# Patient Record
Sex: Female | Born: 1985 | Race: White | Hispanic: No | Marital: Married
Health system: Southern US, Community
[De-identification: ages and names within clinical notes are randomized; demographics above are authoritative.]

---

## 2008-06-03 ENCOUNTER — Inpatient Hospital Stay (HOSPITAL_COMMUNITY): Admission: AD | Admit: 2008-06-03 | Discharge: 2008-06-05 | Payer: Self-pay | Admitting: Obstetrics and Gynecology

## 2010-09-24 ENCOUNTER — Inpatient Hospital Stay (HOSPITAL_COMMUNITY)
Admission: RE | Admit: 2010-09-24 | Discharge: 2010-09-26 | DRG: 775 | Disposition: A | Discharge status: Home / Self Care | Payer: Medicaid Other | Source: Ambulatory Visit | Attending: Obstetrics and Gynecology | Admitting: Obstetrics and Gynecology

## 2010-09-24 DIAGNOSIS — Z22330 Carrier of Group B streptococcus: Secondary | ICD-10-CM

## 2010-09-24 DIAGNOSIS — O99892 Other specified diseases and conditions complicating childbirth: Principal | ICD-10-CM | POA: Diagnosis present

## 2010-09-24 LAB — RPR: RPR Ser Ql: NONREACTIVE

## 2010-09-24 LAB — CBC
HCT: 38.1 % (ref 36.0–46.0)
Hemoglobin: 12.6 g/dL (ref 12.0–15.0)
MCH: 27.5 pg (ref 26.0–34.0)
MCV: 83.2 fL (ref 78.0–100.0)
RBC: 4.58 MIL/uL (ref 3.87–5.11)

## 2010-09-25 LAB — CBC
HCT: 36.3 % (ref 36.0–46.0)
Hemoglobin: 11.8 g/dL — ABNORMAL LOW (ref 12.0–15.0)
MCH: 27.3 pg (ref 26.0–34.0)
MCV: 83.8 fL (ref 78.0–100.0)
RBC: 4.33 MIL/uL (ref 3.87–5.11)

## 2010-09-29 ENCOUNTER — Inpatient Hospital Stay (HOSPITAL_COMMUNITY): Admission: AD | Admit: 2010-09-29 | Payer: Self-pay | Admitting: Obstetrics and Gynecology

## 2010-10-05 NOTE — Discharge Summary (Signed)
  NAMEDAISHA, [name]               ACCOUNT NO.:  [id number]  MEDICAL RECORD NO.:  [id number]           PATIENT TYPE:  I  LOCATION:  9147                          FACILITY:  [facility]  PHYSICIAN:  Huel Cote, M.D. DATE OF BIRTH:  January 22, 1986  DATE OF ADMISSION:  09/24/2010 DATE OF DISCHARGE:  09/26/2010                              DISCHARGE SUMMARY   DISCHARGE DIAGNOSES: 1. Term pregnancy at 39-2/7 weeks, delivered. 2. Positive group B strep status. 3. Status post normal spontaneous vaginal delivery.  DISCHARGE MEDICATIONS: 1. Motrin 600 mg p.o. every 6 hours. 2. Percocet 1-2 tablets p.o. every 4 hours p.r.n. 3. The patient plans Mirena for postpartum contraception.  Hemoglobin on discharge is 11.8.  HOSPITAL COURSE:  The patient is a 25 year old G2 P 1-0-0-1 who came in at 39-2/7 weeks' gestation for induction of labor given term status and favorable cervix.  Prenatal care had been uneventful except for positive group B strep status.  Prenatal labs are as follows:  O+ antibody negative, rubella immune, hepatitis B surface antigen negative, HIV negative, RPR nonreactive, rubella immune, hepatitis B surface antigen negative, GC negative, Chlamydia negative, quad screen negative, group B strep positive, 1-hour Glucola 142, 3-hour Glucola normal.  PAST MEDICAL HISTORY:  None.  PAST SURGICAL HISTORY:  None.  PAST OBSTETRICAL HISTORY:  In 2009, she had a vaginal delivery of an 8 pounds 11 ounces infant.  PAST GYN HISTORY:  None.  ALLERGIES:  None.  MEDICATIONS:  Prenatal vitamins.  SOCIAL HISTORY:  She uses no tobacco, alcohol, or drugs.  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  She was afebrile with stable vital signs.  Fetal heart rate was reactive on admission.  Cervix was 52+ and -2 station.  Estimated fetal weight was 8-1/2 to 9 pounds.  She was placed on penicillin for her group B strep prophylaxis and once that had been on board had rupture of membranes with  clear fluid noted.  She then was placed on Pitocin and progressed well throughout the day.  She reached complete dilation and pushed well with a normal spontaneous vaginal delivery of a viable female infant over small first-degree laceration.  Apgars were 8 and 9, weight was 8 pounds 12 ounces.  Placenta delivery was spontaneous after the cord blood donation and first-degree laceration was repaired with 3-0 Vicryl for hemostasis.  Estimated blood loss was 400 mL.  She was admitted for routine postpartum care.  She did quite well on postpartum day #2.  Her pain was well controlled.  She had no complaints.  Fundus was firm.  Lochia normal, and she was felt stable for discharge home.     Huel Cote, M.D.     KR/MEDQ  D:  09/26/2010  T:  09/26/2010  Job:  [id number]  Electronically Signed by Huel Cote M.D. on 10/05/2010 08:47:06 AM

## 2010-12-24 NOTE — Discharge Summary (Signed)
[name], [name]           ACCOUNT NO.:  [id number]   MEDICAL RECORD NO.:  [id number]          PATIENT TYPE:  INP   LOCATION:  9111                          FACILITY:  [facility]   PHYSICIAN:  Huel Cote, M.D. DATE OF BIRTH:  [birth year]   DATE OF ADMISSION:  06/03/2008  DATE OF DISCHARGE:  06/05/2008                               DISCHARGE SUMMARY   DISCHARGE DIAGNOSES:  1. Term pregnancy at 61 and 4/7th weeks, delivered.  2. Status post normal spontaneous vaginal delivery.   DISCHARGE MEDICATIONS:  1. Motrin 600 mg p.o. every 6 hours.  2. Percocet 1-2 tablets p.r.n. every 4 hours p.r.n. pain.   DISCHARGE FOLLOWUP:  The patient is to follow up in the office in 6  weeks for her routine postpartum exam.   HOSPITAL COURSE:  The patient is a 25 year old G1, P0 who was admitted  at 39 and 4/7th weeks' gestation to Labor and Delivery for induction  given a favorable cervix and term care.  Prenatal care was eventful only  for size greater than dates.  On her last ultrasound, her EF weight was  8 pounds 1 ounce and she also had an ultrasound that demonstrated a  slightly larger than normal extrarenal pelvis.  No other structures were  abnormal including no hydronephrosis.   PRENATAL LABS:  An O positive, antibody negative, RPR nonreactive,  rubella immune, hepatitis B surface antigen negative, HIV negative, GC  negative, Chlamydia negative, group B strep negative, and 1-hour Glucola  normal.   PAST OBSTETRICAL HISTORY:  None.   PAST GYN HISTORY:  None.   PAST MEDICAL HISTORY:  None.   PAST SURGICAL HISTORY:  None.   ALLERGIES:  No allergies.   PHYSICAL EXAMINATION:  VITAL SIGNS:  On admission, she was afebrile with  stable vital signs.  PELVIC:  Fetal heart rate was reactive.  Cervix was 52 and -2 station.  She had rupture of membranes performed with clear fluid noted.  Estimated fetal weight was 8-1/2 to 9 pounds.   She progressed well and reached complete dilation  with a normal  spontaneous vaginal delivery of a vigorous female infant over a second-  degree laceration.  Weight was 8 pounds 11 ounces.  Apgars were 8 and 9  and she has second-degree laceration repaired in normal fashion.  On  postpartum day #2, she was doing quite well and was felt stable for  discharge home.      Huel Cote, M.D.  Electronically Signed     KR/MEDQ  D:  07/12/2008  T:  07/12/2008  Job:  [id number]

## 2011-05-10 LAB — RPR: RPR Ser Ql: NONREACTIVE

## 2011-05-10 LAB — CBC
HCT: 32.3 — ABNORMAL LOW
HCT: 38.6
MCHC: 32.9
MCV: 83.6
MCV: 84.2
Platelets: 188
Platelets: 234
RBC: 3.84 — ABNORMAL LOW
WBC: 10.1
WBC: 14.1 — ABNORMAL HIGH

## 2011-05-10 LAB — CCBB MATERNAL DONOR DRAW

## 2017-01-19 ENCOUNTER — Emergency Department (HOSPITAL_COMMUNITY)
Admission: EM | Admit: 2017-01-19 | Discharge: 2017-01-19 | Disposition: A | Discharge status: Home / Self Care | Payer: No Typology Code available for payment source | Attending: Emergency Medicine | Admitting: Emergency Medicine

## 2017-01-19 ENCOUNTER — Emergency Department (HOSPITAL_COMMUNITY): Payer: No Typology Code available for payment source

## 2017-01-19 ENCOUNTER — Encounter (HOSPITAL_COMMUNITY): Payer: Self-pay | Admitting: Emergency Medicine

## 2017-01-19 DIAGNOSIS — K529 Noninfective gastroenteritis and colitis, unspecified: Secondary | ICD-10-CM | POA: Insufficient documentation

## 2017-01-19 DIAGNOSIS — R1011 Right upper quadrant pain: Secondary | ICD-10-CM | POA: Diagnosis present

## 2017-01-19 LAB — URINALYSIS, ROUTINE W REFLEX MICROSCOPIC
BILIRUBIN URINE: NEGATIVE
GLUCOSE, UA: NEGATIVE mg/dL
KETONES UR: NEGATIVE mg/dL
NITRITE: NEGATIVE
PH: 5 (ref 5.0–8.0)
Protein, ur: NEGATIVE mg/dL
Specific Gravity, Urine: 1.006 (ref 1.005–1.030)

## 2017-01-19 LAB — COMPREHENSIVE METABOLIC PANEL
ALT: 11 U/L — AB (ref 14–54)
AST: 13 U/L — AB (ref 15–41)
Albumin: 4.1 g/dL (ref 3.5–5.0)
Alkaline Phosphatase: 49 U/L (ref 38–126)
Anion gap: 9 (ref 5–15)
BILIRUBIN TOTAL: 1.1 mg/dL (ref 0.3–1.2)
BUN: 10 mg/dL (ref 6–20)
CO2: 21 mmol/L — ABNORMAL LOW (ref 22–32)
CREATININE: 0.74 mg/dL (ref 0.44–1.00)
Calcium: 8.5 mg/dL — ABNORMAL LOW (ref 8.9–10.3)
Chloride: 108 mmol/L (ref 101–111)
Glucose, Bld: 91 mg/dL (ref 65–99)
Potassium: 3.7 mmol/L (ref 3.5–5.1)
Sodium: 138 mmol/L (ref 135–145)
TOTAL PROTEIN: 6.6 g/dL (ref 6.5–8.1)

## 2017-01-19 LAB — CBC
HCT: 42 % (ref 36.0–46.0)
Hemoglobin: 13.9 g/dL (ref 12.0–15.0)
MCH: 28.1 pg (ref 26.0–34.0)
MCHC: 33.1 g/dL (ref 30.0–36.0)
MCV: 84.8 fL (ref 78.0–100.0)
PLATELETS: 250 10*3/uL (ref 150–400)
RBC: 4.95 MIL/uL (ref 3.87–5.11)
RDW: 13 % (ref 11.5–15.5)
WBC: 9.5 10*3/uL (ref 4.0–10.5)

## 2017-01-19 LAB — LIPASE, BLOOD: LIPASE: 24 U/L (ref 11–51)

## 2017-01-19 MED ORDER — SODIUM CHLORIDE 0.9 % IV SOLN
INTRAVENOUS | Status: DC
  Administered 2017-01-19: 15:00:00 via INTRAVENOUS

## 2017-01-19 MED ORDER — HYDROMORPHONE HCL 1 MG/ML IJ SOLN
0.5000 mg | Freq: Once | INTRAMUSCULAR | Status: AC
  Administered 2017-01-19: 0.5 mg via INTRAVENOUS
  Filled 2017-01-19: qty 1

## 2017-01-19 MED ORDER — FAMOTIDINE 20 MG PO TABS: 20.0000 mg | ORAL_TABLET | Freq: Two times a day (BID) | ORAL | 0 refills | Status: DC

## 2017-01-19 MED ORDER — DICYCLOMINE HCL 20 MG PO TABS
20.0000 mg | ORAL_TABLET | Freq: Two times a day (BID) | ORAL | 0 refills | Status: DC
Start: 2017-01-19 — End: 2021-10-29

## 2017-01-19 MED ORDER — FAMOTIDINE IN NACL 20-0.9 MG/50ML-% IV SOLN
20.0000 mg | Freq: Once | INTRAVENOUS | Status: AC
  Administered 2017-01-19: 20 mg via INTRAVENOUS
  Filled 2017-01-19: qty 50

## 2017-01-19 MED ORDER — ONDANSETRON 4 MG PO TBDP: 4.0000 mg | ORAL_TABLET | Freq: Three times a day (TID) | ORAL | 0 refills | Status: DC | PRN

## 2017-01-19 MED ORDER — ONDANSETRON HCL 4 MG/2ML IJ SOLN
4.0000 mg | Freq: Once | INTRAMUSCULAR | Status: AC
  Administered 2017-01-19: 4 mg via INTRAVENOUS
  Filled 2017-01-19: qty 2

## 2017-01-19 NOTE — ED Triage Notes (Signed)
Pt reports sudden onset of RUQ pain that radiates into back with 1 episode of diarrhea and feeling nausea.

## 2017-01-19 NOTE — Discharge Instructions (Signed)
Stay on clear liquids today and then advance slowly. Follow up with Dr. Valentina LucksGriffin or return here if symptoms worsen.

## 2017-01-19 NOTE — ED Provider Notes (Signed)
[facility]-EMERGENCY DEPT Provider Note   CSN: [id number] Arrival date & time: 01/19/17  1242   By signing my name below, I, Clarisse GougeXavier Herndon, attest that this documentation has been prepared under the direction and in the presence of [facility] [place] [facility], FNP. Electronically signed, Clarisse GougeXavier Herndon, ED Scribe. 01/19/17. 1:44 PM.   History   Chief Complaint Chief Complaint  Patient presents with  . Abdominal Pain   The history is provided by the patient and medical records. No language interpreter was used.    [name] is a 31 y.o. female presenting to the Emergency Department with chief complaint of intermittent RUQ pain onset ~6:45 AM today. Associated nausea and diarrhea x 1 noted. 7/10, constant shooting pain described that radiates across the L side into the mid back. No other modifying factors noted.  Pt states she has not eaten today. No urgency, dysuria, hematuria, fever, cough, sore throat or any other complaints noted at this time.   History reviewed. No pertinent past medical history.  There are no active problems to display for this patient.   History reviewed. No pertinent surgical history.  OB History    No data available       Home Medications    Prior to Admission medications   Medication Sig Start Date End Date Taking? Authorizing Provider  dicyclomine (BENTYL) 20 MG tablet Take 1 tablet (20 mg total) by mouth 2 (two) times daily. 01/19/17   Janne NapoleonNeese, Hope M, NP  famotidine (PEPCID) 20 MG tablet Take 1 tablet (20 mg total) by mouth 2 (two) times daily. 01/19/17   Janne NapoleonNeese, Hope M, NP  ondansetron (ZOFRAN ODT) 4 MG disintegrating tablet Take 1 tablet (4 mg total) by mouth every 8 (eight) hours as needed for nausea or vomiting. 01/19/17   Janne NapoleonNeese, Hope M, NP    Family History No family history on file.  Social History Social History  Substance Use Topics  . Smoking status: Not on file  . Smokeless tobacco: Not on file  . Alcohol use Not on file     Allergies   Patient  has no known allergies.   Review of Systems Review of Systems  Gastrointestinal: Positive for abdominal pain, diarrhea and nausea. Negative for vomiting.  Musculoskeletal: Positive for back pain.  All other systems reviewed and are negative.    Physical Exam Updated Vital Signs BP 135/74 (BP Location: Right Arm)   Pulse 97   Temp 98.2 F (36.8 C) (Oral)   Resp 16   Ht 5\' 6"  (1.676 m)   Wt 180 lb (81.6 kg)   LMP 12/24/2016   SpO2 97%   BMI 29.05 kg/m   Physical Exam  Constitutional: She appears well-developed and well-nourished. No distress.  HENT:  Head: Normocephalic.  Eyes: EOM are normal.  Neck: Normal range of motion.  Cardiovascular: Normal rate and regular rhythm.   Pulmonary/Chest: Effort normal and breath sounds normal.  Abdominal: Soft. She exhibits no distension and no pulsatile midline mass. Bowel sounds are increased. There is tenderness in the right upper quadrant and epigastric area. There is no rebound and no guarding.  Pain in RUQ radiates to back  Musculoskeletal: Normal range of motion.  No lower extremity edema.  Neurological: She is alert.  Psychiatric: She has a normal mood and affect. Her behavior is normal.  Nursing note and vitals reviewed.    ED Treatments / Results  DIAGNOSTIC STUDIES: Oxygen Saturation is 97% on RA, NL by my interpretation.    COORDINATION OF  CARE: 1:42 PM-Discussed next steps with pt. Pt verbalized understanding and is agreeable with the plan. Will review labs and order [place]. Will order IV medications.   Labs (all labs ordered are listed, but only abnormal results are displayed) Labs Reviewed  COMPREHENSIVE METABOLIC PANEL - Abnormal; Notable for the following:       Result Value   CO2 21 (*)    Calcium 8.5 (*)    AST 13 (*)    ALT 11 (*)    All other components within normal limits  URINALYSIS, ROUTINE W REFLEX MICROSCOPIC - Abnormal; Notable for the following:    Hgb urine dipstick SMALL (*)    Leukocytes, UA  MODERATE (*)    Bacteria, UA RARE (*)    Squamous Epithelial / LPF 0-5 (*)    All other components within normal limits  LIPASE, BLOOD  CBC    Radiology US Abdomen Limited  Result Date: 01/19/2017 CLINICAL DATA:  Patient with right upper quadrant pain. EXAM: ULTRASOUND ABDOMEN LIMITED RIGHT UPPER QUADRANT COMPARISON:  None. FINDINGS: Gallbladder: No gallstones or wall thickening visualized. No sonographic Murphy sign noted by sonographer. Common bile duct: Diameter: 3 mm Liver: No focal lesion identified. Within normal limits in parenchymal echogenicity. IMPRESSION: Normal right upper quadrant ultrasound. No cholelithiasis or sonographic evidence for acute cholecystitis. Electronically Signed   By: Annia Belt M.D.   On: 01/19/2017 14:43    Procedures Procedures (including critical care time)  Medications Ordered in ED Medications  0.9 %  sodium chloride infusion ( Intravenous New Bag/Given 01/19/17 1451)  ondansetron (ZOFRAN) injection 4 mg (4 mg Intravenous Given 01/19/17 1403)  HYDROmorphone (DILAUDID) injection 0.5 mg (0.5 mg Intravenous Given 01/19/17 1403)  famotidine (PEPCID) IVPB 20 mg premix (0 mg Intravenous Stopped 01/19/17 1450)     Initial Impression / Assessment and Plan / ED Course  I have reviewed the triage vital signs and the nursing notes.  Pertinent labs & imaging results that were available during my care of the patient were reviewed by me and considered in my medical decision making (see chart for details).   Final Clinical Impressions(s) / ED Diagnoses  31 y.o. female with epigastric and RUQ pain with one episode of diarrhea that started this morning stable for d/c without elevated WBC, no fever and normal ultrasound of abdomen. Will treat for gastroenteritis and patient to f/u with her PCP or return here for worsening symptoms.   Final diagnoses:  Right upper quadrant abdominal pain  Gastroenteritis    New Prescriptions New Prescriptions   DICYCLOMINE  (BENTYL) 20 MG TABLET    Take 1 tablet (20 mg total) by mouth 2 (two) times daily.   FAMOTIDINE (PEPCID) 20 MG TABLET    Take 1 tablet (20 mg total) by mouth 2 (two) times daily.   ONDANSETRON (ZOFRAN ODT) 4 MG DISINTEGRATING TABLET    Take 1 tablet (4 mg total) by mouth every 8 (eight) hours as needed for nausea or vomiting.  I personally performed the services described in this documentation, which was scribed in my presence. The recorded information has been reviewed and is accurate.    Kerrie Buffalo [place], [place] 01/19/17 1531    Rolland Porter, MD 01/23/17 [phone]

## 2017-01-19 NOTE — ED Notes (Signed)
Patient transported to Ultrasound 

## 2018-04-16 ENCOUNTER — Other Ambulatory Visit: Payer: Self-pay | Admitting: Family Medicine

## 2018-04-16 ENCOUNTER — Other Ambulatory Visit (HOSPITAL_COMMUNITY)
Admission: RE | Admit: 2018-04-16 | Discharge: 2018-04-16 | Disposition: A | Discharge status: Home / Self Care | Payer: No Typology Code available for payment source | Source: Ambulatory Visit | Attending: Family Medicine | Admitting: Family Medicine

## 2018-04-16 DIAGNOSIS — Z01419 Encounter for gynecological examination (general) (routine) without abnormal findings: Secondary | ICD-10-CM | POA: Diagnosis present

## 2018-04-18 LAB — CYTOLOGY - PAP: Diagnosis: NEGATIVE

## 2018-06-22 ENCOUNTER — Ambulatory Visit
Admission: RE | Admit: 2018-06-22 | Discharge: 2018-06-22 | Disposition: A | Discharge status: Home / Self Care | Payer: No Typology Code available for payment source | Source: Ambulatory Visit | Attending: Nurse Practitioner | Admitting: Nurse Practitioner

## 2018-06-22 ENCOUNTER — Other Ambulatory Visit: Payer: Self-pay | Admitting: Nurse Practitioner

## 2018-06-22 DIAGNOSIS — S40022A Contusion of left upper arm, initial encounter: Secondary | ICD-10-CM

## 2019-03-04 IMAGING — US US ABDOMEN LIMITED
1 series · 14 of 25 positions shown · non-contrast
Comparison: None.

CLINICAL DATA: Patient with right upper quadrant pain.

EXAM:
ULTRASOUND ABDOMEN LIMITED RIGHT UPPER QUADRANT

[Series 1: us abdomen limited · 0.23mm/px · 14 of 48 slices shown]
[im 1/48]
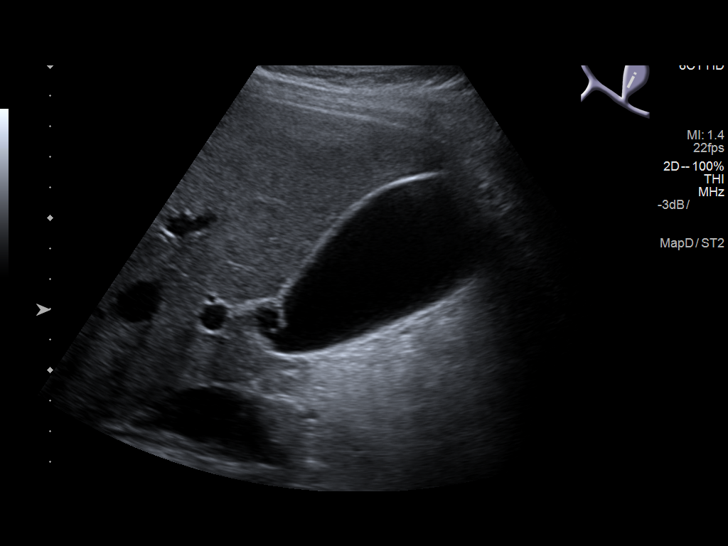
[im 4/48]
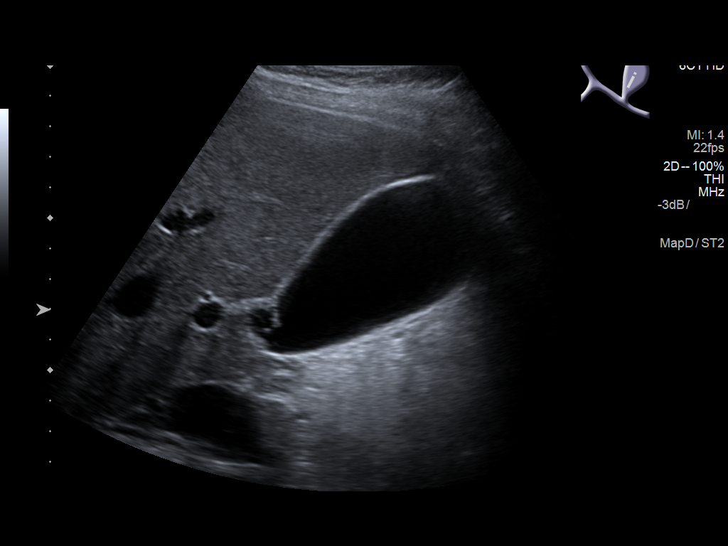
[im 8/48]
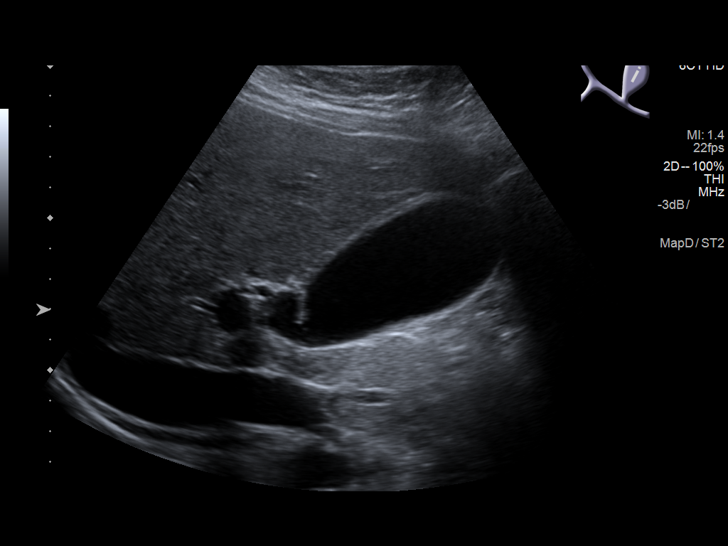
[im 12/48]
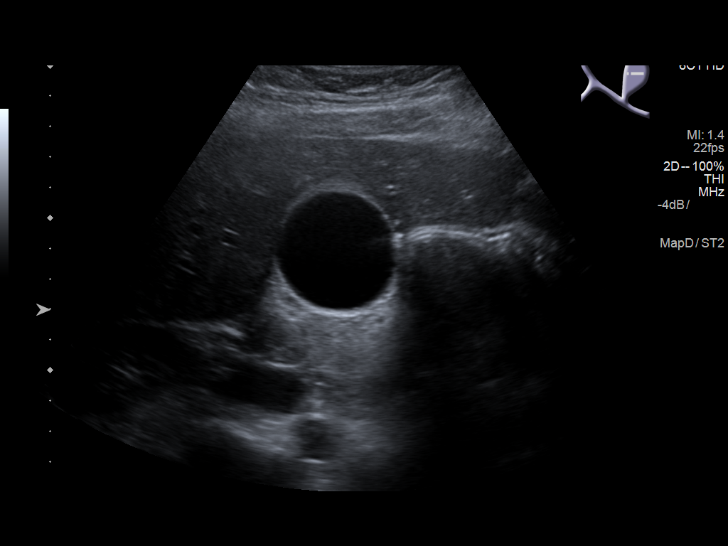
[im 16/48]
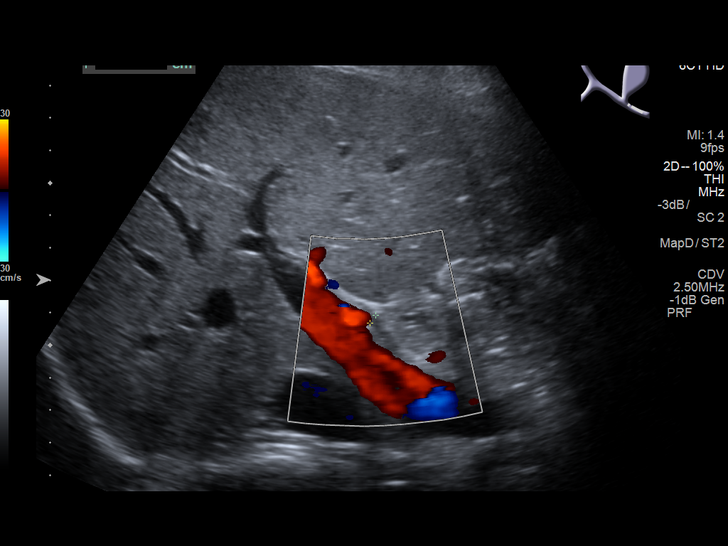
[im 18/48]
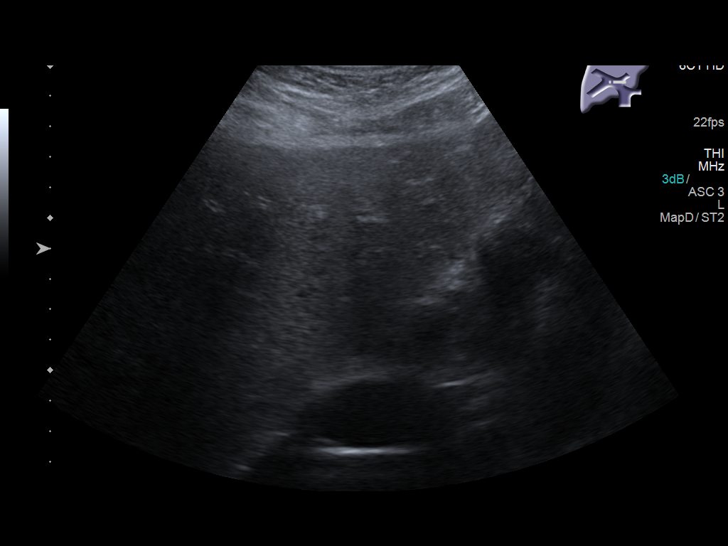
[im 22/48]
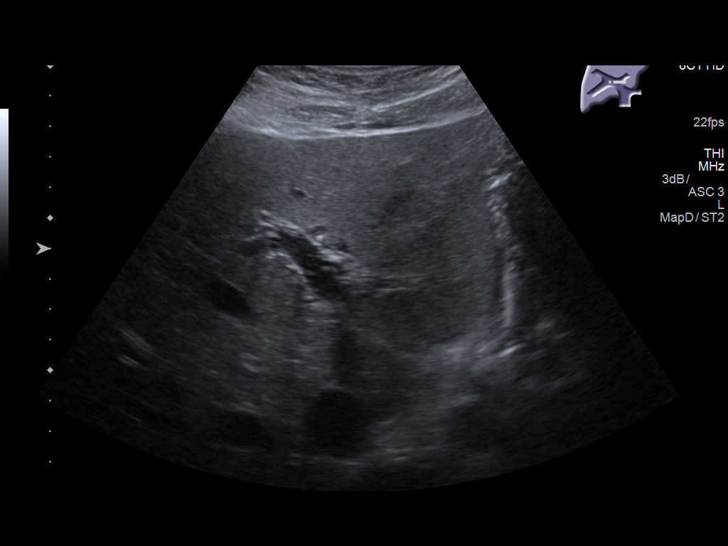
[im 26/48]
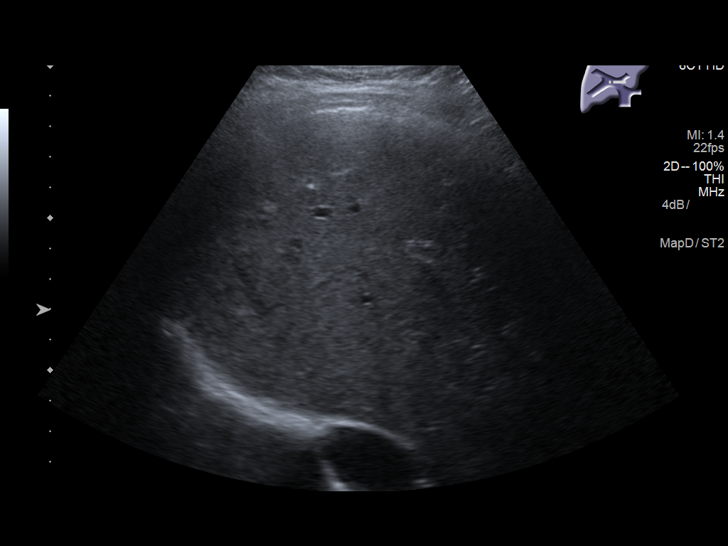
[im 30/48]
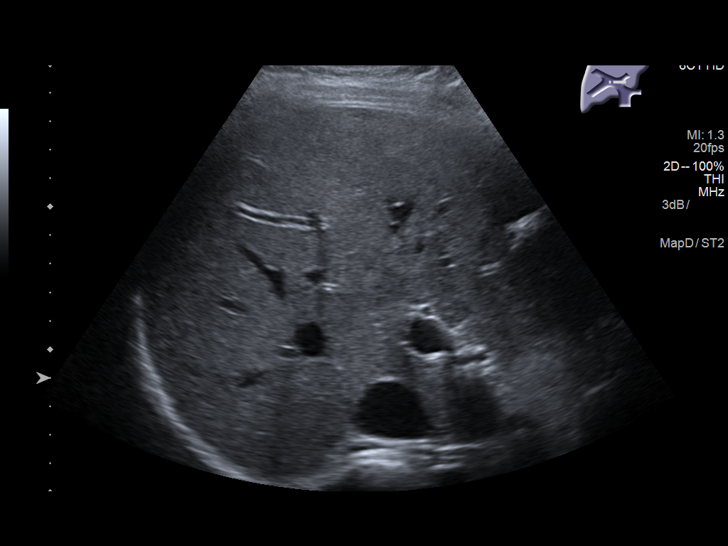
[im 32/48]
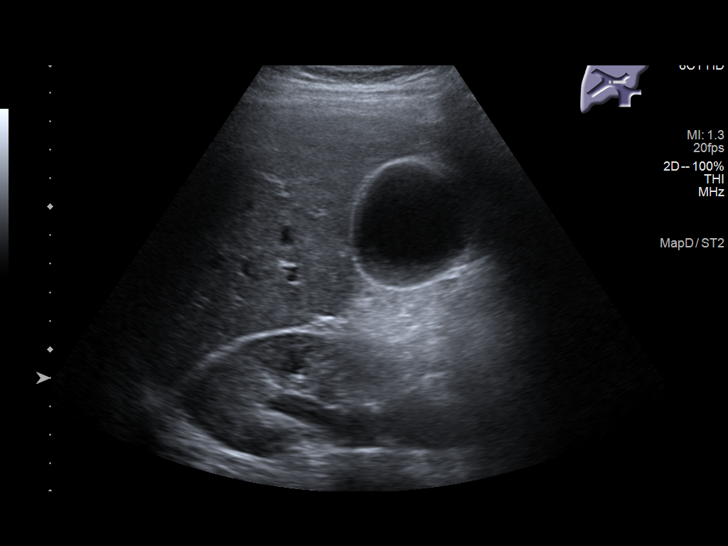
[im 36/48]
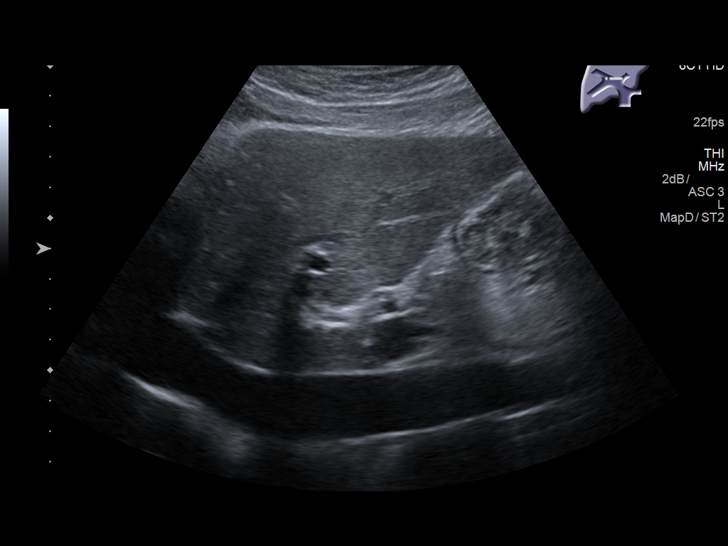
[im 40/48]
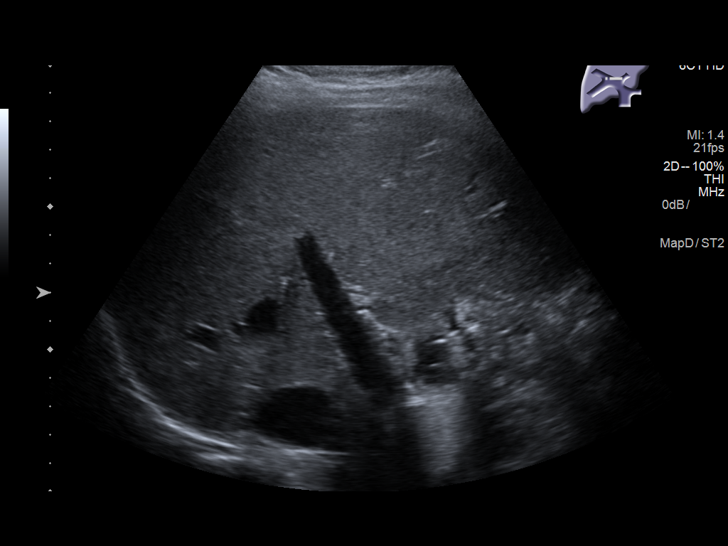
[im 44/48]
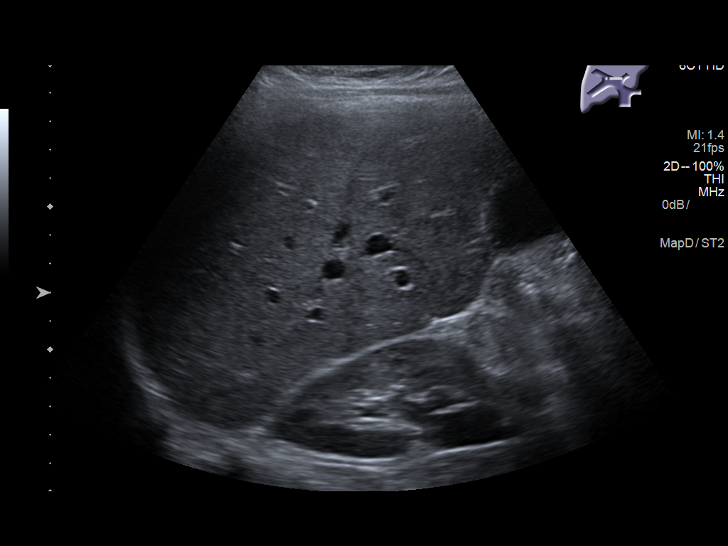
[im 48/48]
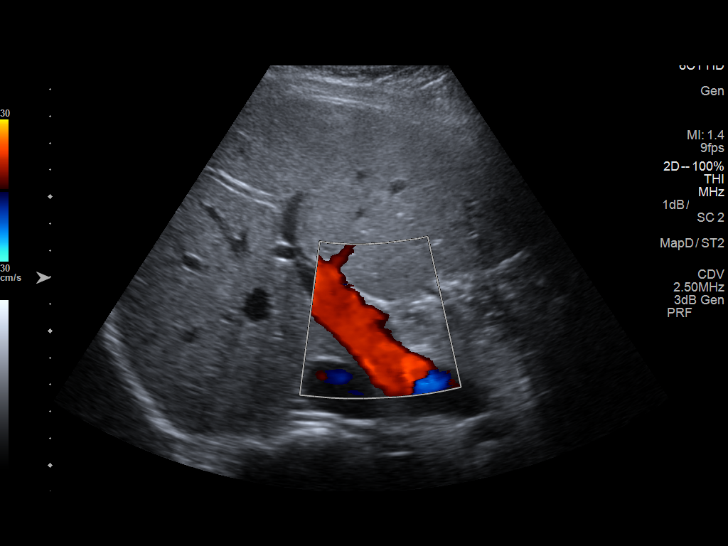

[14 of 25 positions shown; findings below may reference images not displayed]

FINDINGS: Gallbladder:

No gallstones or wall thickening visualized. No sonographic Murphy
sign noted by sonographer.

Common bile duct:

Diameter: 3 mm

Liver:

No focal lesion identified. Within normal limits in parenchymal
echogenicity.
IMPRESSION: Normal right upper quadrant ultrasound. No cholelithiasis or
sonographic evidence for acute cholecystitis.

## 2019-06-13 ENCOUNTER — Other Ambulatory Visit: Payer: Self-pay

## 2019-06-13 DIAGNOSIS — Z20822 Contact with and (suspected) exposure to covid-19: Secondary | ICD-10-CM

## 2019-06-15 LAB — NOVEL CORONAVIRUS, NAA: SARS-CoV-2, NAA: NOT DETECTED

## 2020-06-10 ENCOUNTER — Ambulatory Visit: Payer: No Typology Code available for payment source

## 2020-08-04 IMAGING — US US EXTREM UP*L* LTD
1 series · 7 of 7 positions shown · non-contrast
Comparison: None.

CLINICAL DATA: Phlebotomy yesterday. Left elbow bruising and
swelling.

EXAM:
ULTRASOUND LEFT UPPER EXTREMITY LIMITED
TECHNIQUE: Ultrasound examination of the upper extremity soft tissues was
performed in the area of clinical concern.

[Series 1: us extrem up*left* ltd · 0.06mm/px · 7 acquisitions, 7 frames shown]
[im 1/7]
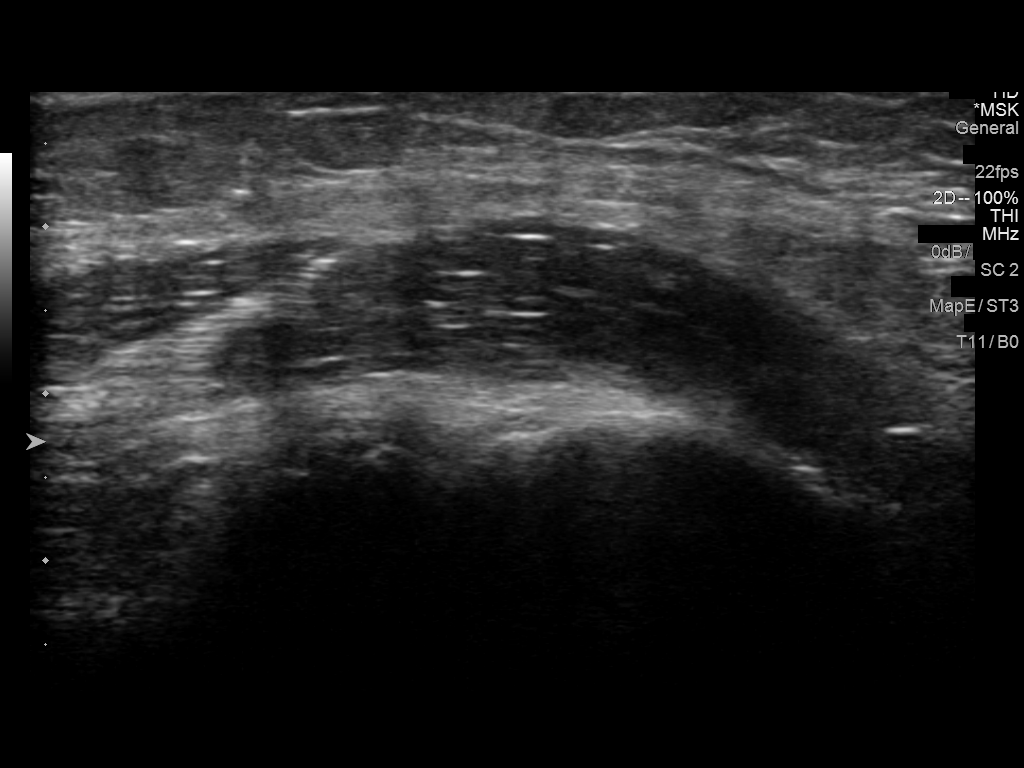
[im 2/7]
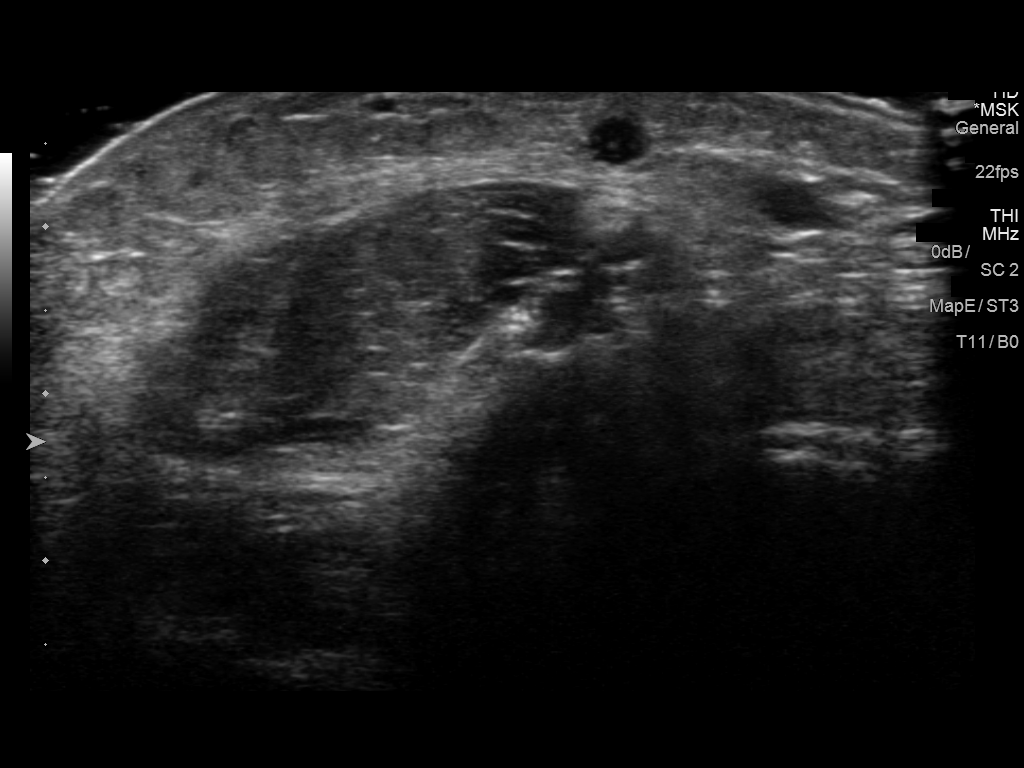
[im 3/7]
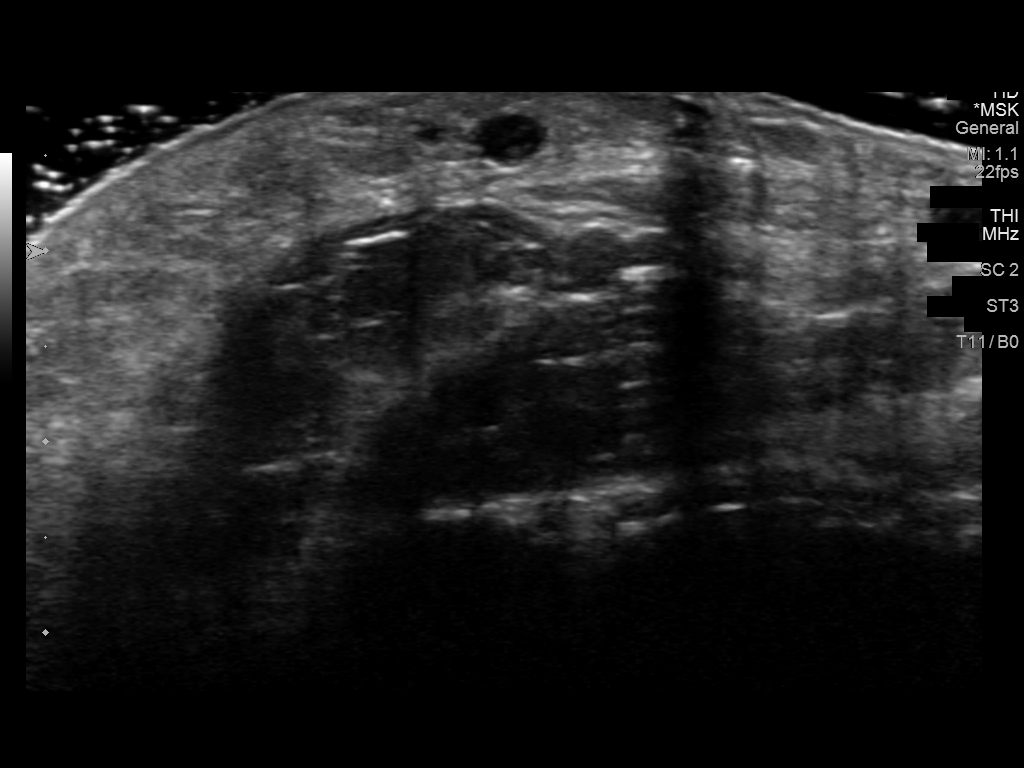
[im 4/7]
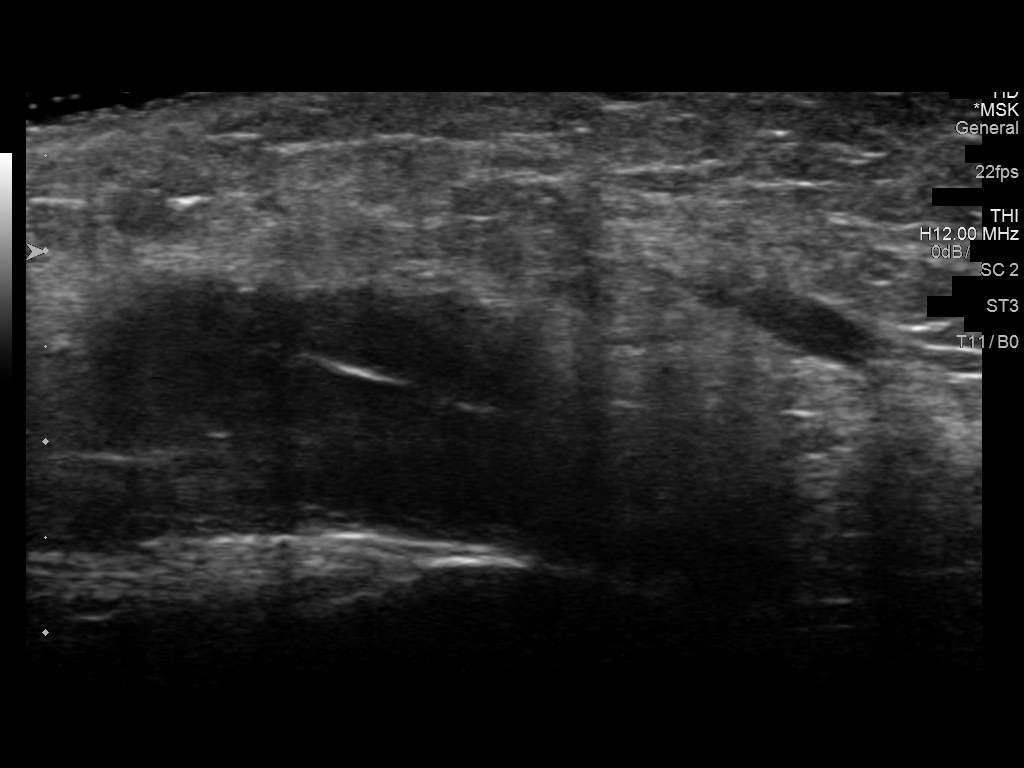
[im 5/7]
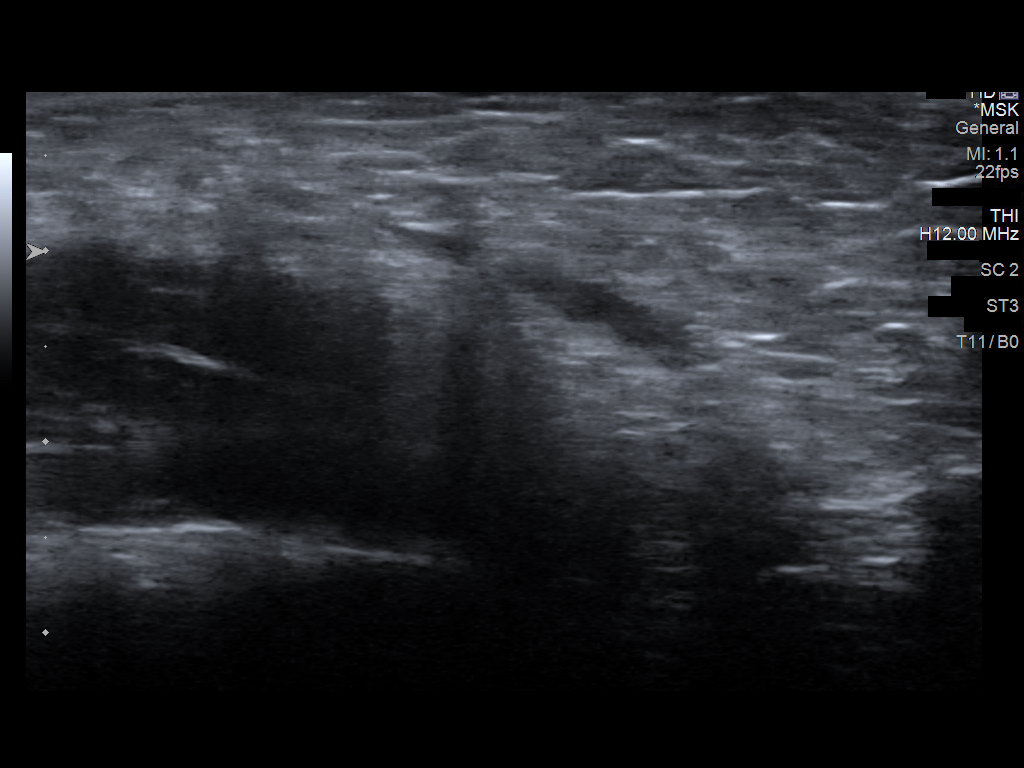
[im 6/7]
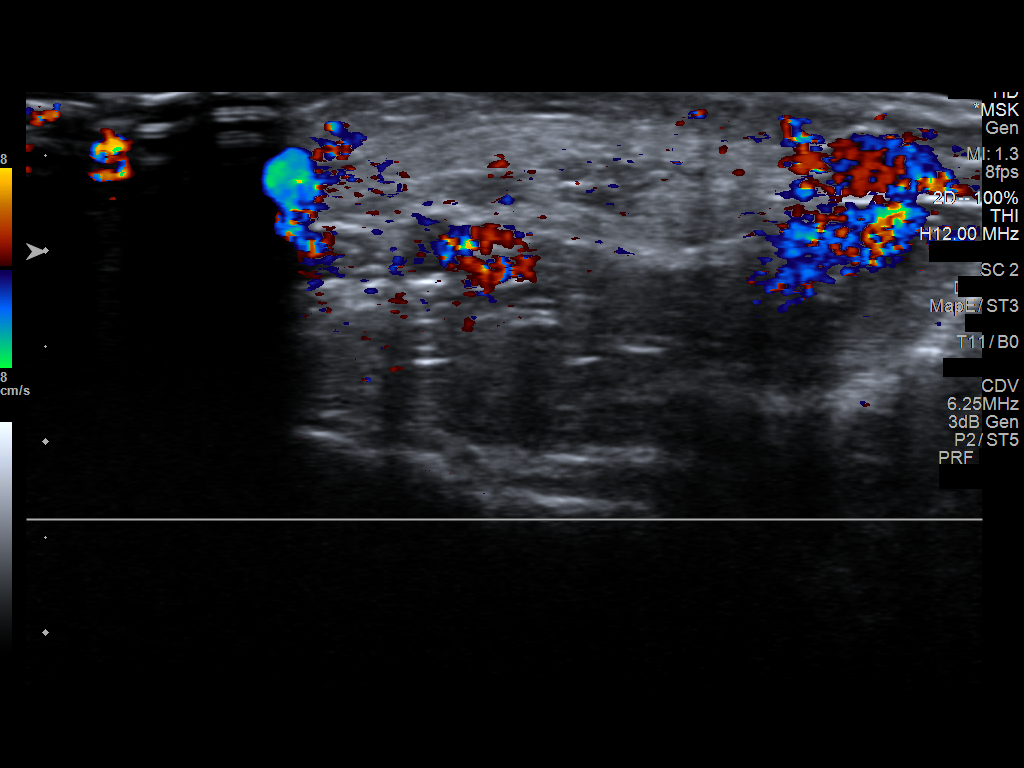
[im 7/7]
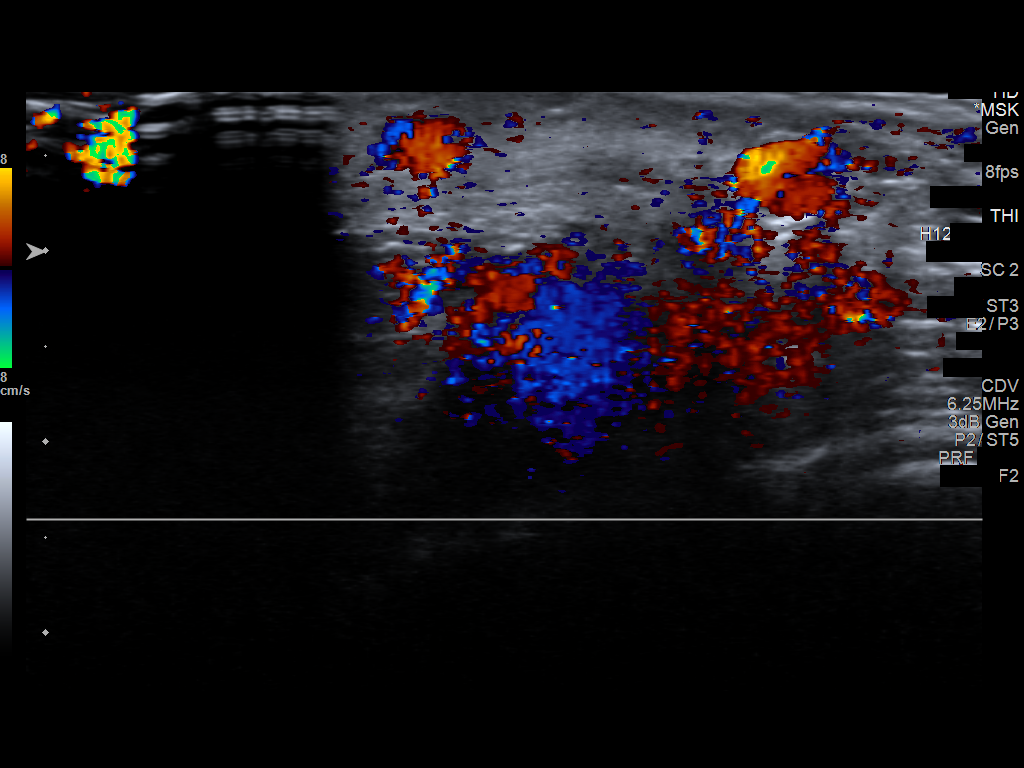

[7 of 7 positions shown; findings below may reference images not displayed]

FINDINGS: Ultrasound concentrating on the left antecubital fossa demonstrates
no hematoma or other focal fluid collection. No vascular
abnormalities are identified, although dedicated vascular assessment
not performed.
IMPRESSION: No hematoma or other significant abnormality identified within the
left antecubital fossa.

## 2021-07-26 ENCOUNTER — Other Ambulatory Visit (HOSPITAL_COMMUNITY): Payer: Self-pay

## 2021-07-26 MED ORDER — TRIAMCINOLONE ACETONIDE 0.025 % EX OINT
1.0000 "application " | TOPICAL_OINTMENT | Freq: Two times a day (BID) | CUTANEOUS | 0 refills | Status: AC
  Filled 2021-07-26: qty 15, 10d supply, fill #0

## 2021-07-26 MED ORDER — BUPROPION HCL ER (SR) 100 MG PO TB12
100.0000 mg | ORAL_TABLET | Freq: Every morning | ORAL | 6 refills | Status: DC
  Filled 2021-07-26: qty 30, 30d supply, fill #0
  Filled 2021-09-02: qty 30, 30d supply, fill #1

## 2021-09-01 ENCOUNTER — Other Ambulatory Visit (HOSPITAL_COMMUNITY): Payer: Self-pay

## 2021-09-01 MED ORDER — VITAMIN D (ERGOCALCIFEROL) 1.25 MG (50000 UNIT) PO CAPS
50000.0000 [IU] | ORAL_CAPSULE | ORAL | 2 refills | Status: AC
  Filled 2021-09-01: qty 4, 28d supply, fill #0
  Filled 2021-09-27: qty 4, 28d supply, fill #1

## 2021-09-02 ENCOUNTER — Other Ambulatory Visit (HOSPITAL_COMMUNITY): Payer: Self-pay

## 2021-09-27 ENCOUNTER — Other Ambulatory Visit (HOSPITAL_COMMUNITY): Payer: Self-pay

## 2021-09-28 ENCOUNTER — Other Ambulatory Visit (HOSPITAL_COMMUNITY): Payer: Self-pay

## 2021-10-06 ENCOUNTER — Other Ambulatory Visit (HOSPITAL_COMMUNITY): Payer: Self-pay

## 2021-10-06 MED ORDER — BUPROPION HCL ER (SR) 150 MG PO TB12
150.0000 mg | ORAL_TABLET | Freq: Two times a day (BID) | ORAL | 6 refills | Status: AC
  Filled 2021-10-06: qty 60, 30d supply, fill #0

## 2021-10-29 ENCOUNTER — Other Ambulatory Visit (HOSPITAL_COMMUNITY): Payer: Self-pay

## 2021-10-29 ENCOUNTER — Other Ambulatory Visit: Payer: Self-pay | Admitting: Family

## 2021-10-29 MED ORDER — PREDNISONE 50 MG PO TABS
50.0000 mg | ORAL_TABLET | Freq: Every day | ORAL | 0 refills | Status: AC
  Filled 2021-10-29: qty 5, 5d supply, fill #0

## 2022-08-31 ENCOUNTER — Other Ambulatory Visit (HOSPITAL_COMMUNITY): Payer: Self-pay

## 2022-08-31 DIAGNOSIS — J029 Acute pharyngitis, unspecified: Secondary | ICD-10-CM | POA: Diagnosis not present

## 2022-08-31 DIAGNOSIS — F419 Anxiety disorder, unspecified: Secondary | ICD-10-CM | POA: Diagnosis not present

## 2022-08-31 DIAGNOSIS — Z0000 Encounter for general adult medical examination without abnormal findings: Secondary | ICD-10-CM | POA: Diagnosis not present

## 2022-08-31 DIAGNOSIS — R52 Pain, unspecified: Secondary | ICD-10-CM | POA: Diagnosis not present

## 2022-08-31 DIAGNOSIS — F320 Major depressive disorder, single episode, mild: Secondary | ICD-10-CM | POA: Diagnosis not present

## 2022-08-31 MED ORDER — BUPROPION HCL ER (XL) 150 MG PO TB24
150.0000 mg | ORAL_TABLET | Freq: Every morning | ORAL | 6 refills | Status: AC
  Filled 2022-08-31: qty 30, 30d supply, fill #0
  Filled 2022-09-27: qty 30, 30d supply, fill #1

## 2022-09-09 ENCOUNTER — Other Ambulatory Visit: Payer: Self-pay | Admitting: Orthopedic Surgery

## 2022-09-09 MED ORDER — AZITHROMYCIN 1 G PO PACK: 1.0000 g | PACK | Freq: Once | ORAL | 0 refills | Status: AC

## 2022-10-03 ENCOUNTER — Other Ambulatory Visit (HOSPITAL_COMMUNITY): Payer: Self-pay

## 2022-10-12 ENCOUNTER — Other Ambulatory Visit (HOSPITAL_COMMUNITY): Payer: Self-pay

## 2022-10-12 DIAGNOSIS — F419 Anxiety disorder, unspecified: Secondary | ICD-10-CM | POA: Diagnosis not present

## 2022-10-12 DIAGNOSIS — H6123 Impacted cerumen, bilateral: Secondary | ICD-10-CM | POA: Diagnosis not present

## 2022-10-12 MED ORDER — BUPROPION HCL ER (XL) 300 MG PO TB24
300.0000 mg | ORAL_TABLET | Freq: Every morning | ORAL | 3 refills | Status: DC
  Filled 2022-10-12: qty 90, 90d supply, fill #0
  Filled 2023-01-24: qty 90, 90d supply, fill #1
  Filled 2023-05-03: qty 90, 90d supply, fill #2
  Filled 2023-07-27 – 2023-08-15 (×3): qty 90, 90d supply, fill #3

## 2023-05-03 ENCOUNTER — Other Ambulatory Visit (HOSPITAL_COMMUNITY): Payer: Self-pay

## 2023-07-27 ENCOUNTER — Other Ambulatory Visit: Payer: Self-pay

## 2023-08-01 ENCOUNTER — Other Ambulatory Visit: Payer: Self-pay

## 2023-08-15 ENCOUNTER — Other Ambulatory Visit (HOSPITAL_COMMUNITY): Payer: Self-pay

## 2023-11-06 ENCOUNTER — Other Ambulatory Visit (HOSPITAL_COMMUNITY): Payer: Self-pay

## 2023-11-07 ENCOUNTER — Other Ambulatory Visit: Payer: Self-pay

## 2023-11-07 ENCOUNTER — Other Ambulatory Visit (HOSPITAL_COMMUNITY): Payer: Self-pay

## 2023-11-07 MED ORDER — BUPROPION HCL ER (XL) 300 MG PO TB24
300.0000 mg | ORAL_TABLET | Freq: Every morning | ORAL | 1 refills | Status: AC
  Filled 2023-11-07 (×2): qty 90, 90d supply, fill #0
  Filled 2024-03-26 – 2024-04-02 (×2): qty 90, 90d supply, fill #1

## 2024-03-13 DIAGNOSIS — L509 Urticaria, unspecified: Secondary | ICD-10-CM | POA: Diagnosis not present

## 2024-03-26 ENCOUNTER — Other Ambulatory Visit (HOSPITAL_COMMUNITY): Payer: Self-pay

## 2024-03-26 ENCOUNTER — Encounter: Payer: Self-pay | Admitting: Pharmacist

## 2024-03-26 ENCOUNTER — Other Ambulatory Visit: Payer: Self-pay

## 2024-03-27 DIAGNOSIS — Z0000 Encounter for general adult medical examination without abnormal findings: Secondary | ICD-10-CM | POA: Diagnosis not present

## 2024-03-27 DIAGNOSIS — R5383 Other fatigue: Secondary | ICD-10-CM | POA: Diagnosis not present

## 2024-03-27 DIAGNOSIS — Z23 Encounter for immunization: Secondary | ICD-10-CM | POA: Diagnosis not present

## 2024-03-28 ENCOUNTER — Other Ambulatory Visit: Payer: Self-pay

## 2024-04-02 ENCOUNTER — Other Ambulatory Visit (HOSPITAL_COMMUNITY): Payer: Self-pay

## 2024-06-21 ENCOUNTER — Other Ambulatory Visit (HOSPITAL_COMMUNITY): Payer: Self-pay
